# Patient Record
Sex: Male | Born: 1990 | Race: Black or African American | Hispanic: No | Marital: Single | Smoking: Never smoker
Health system: Southern US, Community
[De-identification: ages and names within clinical notes are randomized; demographics above are authoritative.]

## PROBLEM LIST (undated history)

## (undated) HISTORY — PX: MULTIPLE TOOTH EXTRACTIONS: SHX2053

---
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
## 2013-02-28 ENCOUNTER — Ambulatory Visit
Admission: RE | Admit: 2013-02-28 | Discharge: 2013-02-28 | Disposition: A | Discharge status: Home / Self Care | Payer: BC Managed Care – PPO | Source: Ambulatory Visit | Attending: Orthopedic Surgery | Admitting: Orthopedic Surgery

## 2013-02-28 ENCOUNTER — Other Ambulatory Visit: Payer: Self-pay | Admitting: Orthopedic Surgery

## 2013-02-28 DIAGNOSIS — M719 Bursopathy, unspecified: Secondary | ICD-10-CM

## 2013-09-08 ENCOUNTER — Other Ambulatory Visit: Payer: Self-pay | Admitting: Family Medicine

## 2013-09-08 ENCOUNTER — Ambulatory Visit
Admission: RE | Admit: 2013-09-08 | Discharge: 2013-09-08 | Disposition: A | Discharge status: Home / Self Care | Payer: BC Managed Care – PPO | Source: Ambulatory Visit | Attending: Family Medicine | Admitting: Family Medicine

## 2013-09-08 DIAGNOSIS — R109 Unspecified abdominal pain: Secondary | ICD-10-CM

## 2013-09-08 MED ORDER — IOHEXOL 350 MG/ML SOLN: 100.0000 mL | Freq: Once | INTRAVENOUS | Status: AC | PRN

## 2013-09-10 ENCOUNTER — Other Ambulatory Visit: Payer: Self-pay | Admitting: Gastroenterology

## 2013-09-10 ENCOUNTER — Other Ambulatory Visit: Payer: BC Managed Care – PPO

## 2013-09-10 DIAGNOSIS — R110 Nausea: Secondary | ICD-10-CM

## 2013-09-10 DIAGNOSIS — R1011 Right upper quadrant pain: Secondary | ICD-10-CM

## 2013-09-11 ENCOUNTER — Ambulatory Visit
Admission: RE | Admit: 2013-09-11 | Discharge: 2013-09-11 | Disposition: A | Discharge status: Home / Self Care | Payer: BC Managed Care – PPO | Source: Ambulatory Visit | Attending: Gastroenterology | Admitting: Gastroenterology

## 2013-09-11 DIAGNOSIS — R110 Nausea: Secondary | ICD-10-CM

## 2013-09-11 DIAGNOSIS — R1011 Right upper quadrant pain: Secondary | ICD-10-CM

## 2013-09-29 ENCOUNTER — Encounter (HOSPITAL_COMMUNITY)
Admission: RE | Admit: 2013-09-29 | Discharge: 2013-09-29 | Disposition: A | Discharge status: Home / Self Care | Payer: BC Managed Care – PPO | Source: Ambulatory Visit | Attending: Gastroenterology | Admitting: Gastroenterology

## 2013-09-29 DIAGNOSIS — R1011 Right upper quadrant pain: Secondary | ICD-10-CM

## 2013-09-29 DIAGNOSIS — R110 Nausea: Secondary | ICD-10-CM

## 2013-09-29 MED ORDER — TECHNETIUM TC 99M MEBROFENIN IV KIT
5.0000 | PACK | Freq: Once | INTRAVENOUS | Status: AC | PRN
  Administered 2013-09-29: 5 via INTRAVENOUS

## 2013-09-29 MED ORDER — STERILE WATER FOR INJECTION IJ SOLN
INTRAMUSCULAR | Status: AC
  Filled 2013-09-29: qty 10

## 2013-09-29 MED ORDER — SINCALIDE 5 MCG IJ SOLR
INTRAMUSCULAR | Status: AC
  Administered 2013-09-29: 1.45 ug
  Filled 2013-09-29: qty 5

## 2014-02-02 ENCOUNTER — Other Ambulatory Visit: Payer: Self-pay | Admitting: Orthopedic Surgery

## 2014-02-02 ENCOUNTER — Ambulatory Visit
Admission: RE | Admit: 2014-02-02 | Discharge: 2014-02-02 | Disposition: A | Discharge status: Home / Self Care | Payer: BC Managed Care – PPO | Source: Ambulatory Visit | Attending: Orthopedic Surgery | Admitting: Orthopedic Surgery

## 2014-02-02 DIAGNOSIS — M25571 Pain in right ankle and joints of right foot: Secondary | ICD-10-CM

## 2015-01-29 IMAGING — NM NM HEPATO W/GB/PHARM/[PERSON_NAME]
2 series · 12 of 12 positions shown · non-contrast
Comparison: Abdominal ultrasound and CT scan dated 09/08/2013

RADIOPHARMACEUTICALS:  5.0 mCi technetium Choletec.

1.5 mcg CCK IV.

CLINICAL DATA: Right upper quadrant pain and nausea.

EXAM:
NUCLEAR MEDICINE HEPATOBILIARY IMAGING WITH GALLBLADDER EF
TECHNIQUE: Sequential images of the abdomen were obtained [DATE] minutes
following intravenous administration of radiopharmaceutical. After
oral ingestion of Ensure, gallbladder ejection fraction was
determined.

[Series 0: hepatobiliary · 3.20mm/px · 6 of 60 frames shown (1 of 2)]
[frame 6/60]
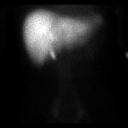
[frame 16/60]
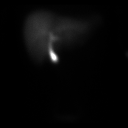
[frame 26/60]
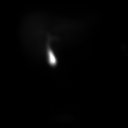
[frame 36/60]
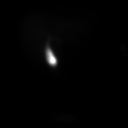
[frame 46/60]
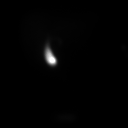
[frame 56/60]
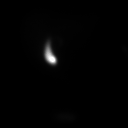

[Series 0: hepatobiliary · 3.20mm/px · 6 of 30 frames shown (2 of 2)]
[frame 3/30]
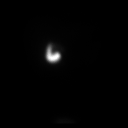
[frame 8/30]
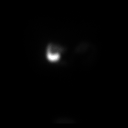
[frame 13/30]
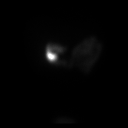
[frame 18/30]
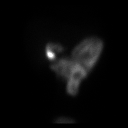
[frame 23/30]
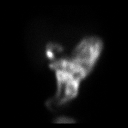
[frame 28/30]
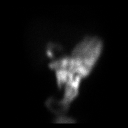

[12 of 12 positions shown; findings below may reference images not displayed]

FINDINGS: The gallbladder is visualized at 10 min. Ejection fraction was 94%
at 29 min.
IMPRESSION: Normal hepatobiliary scan.

## 2015-06-04 IMAGING — CR DG ANKLE COMPLETE 3+V*R*
3 series · 3 of 3 positions shown · non-contrast
Comparison: None.

CLINICAL DATA: Pain post trauma

EXAM:
RIGHT ANKLE - COMPLETE 3+ VIEW

[view not recorded (1 of 3)]
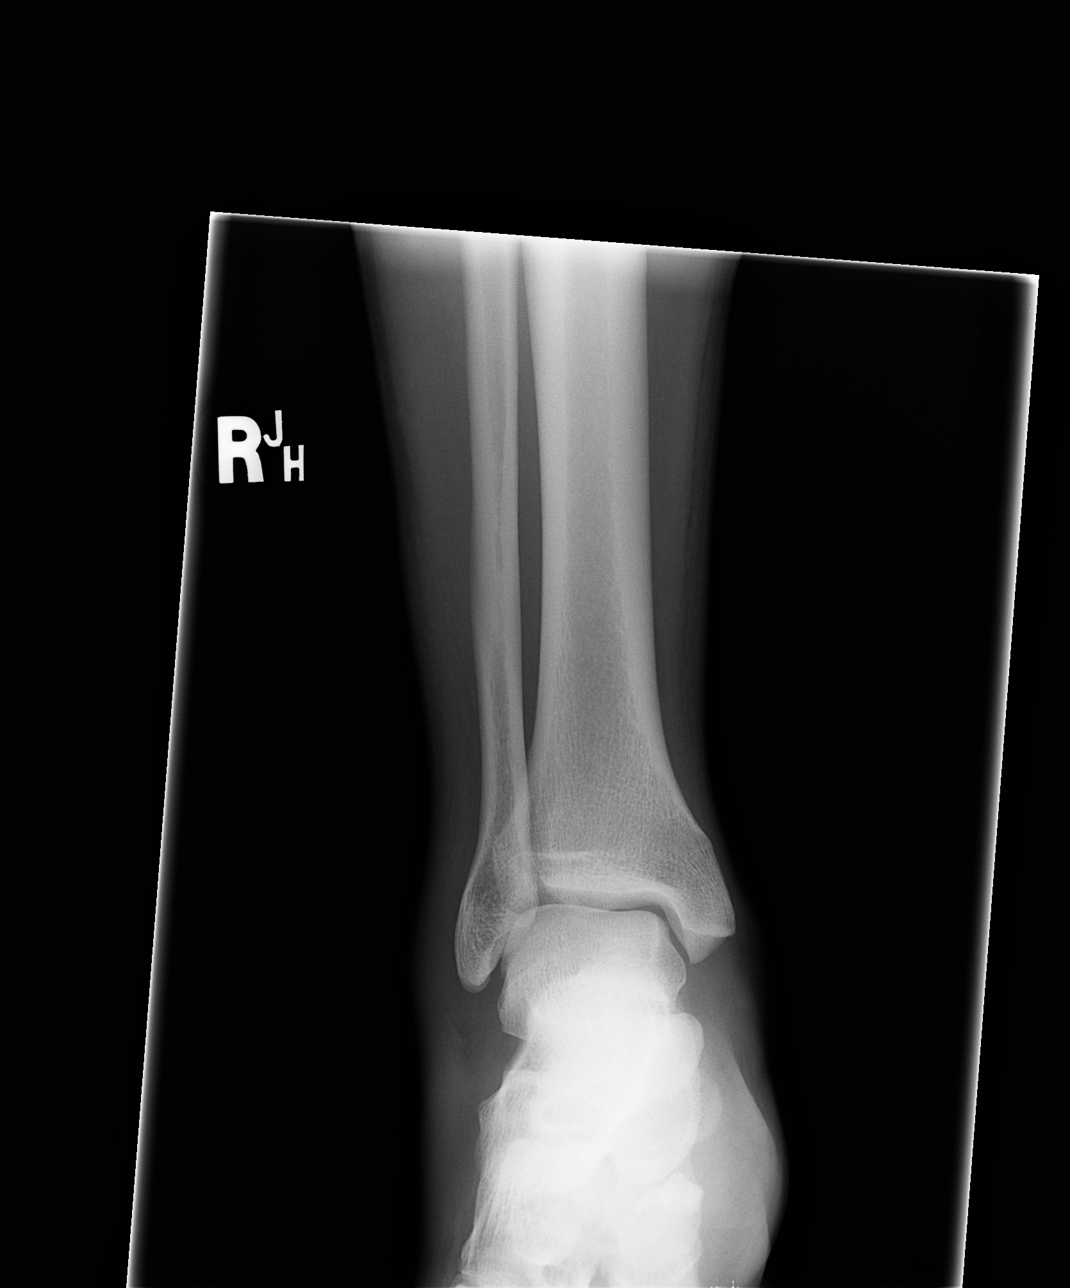

[view not recorded (2 of 3)]
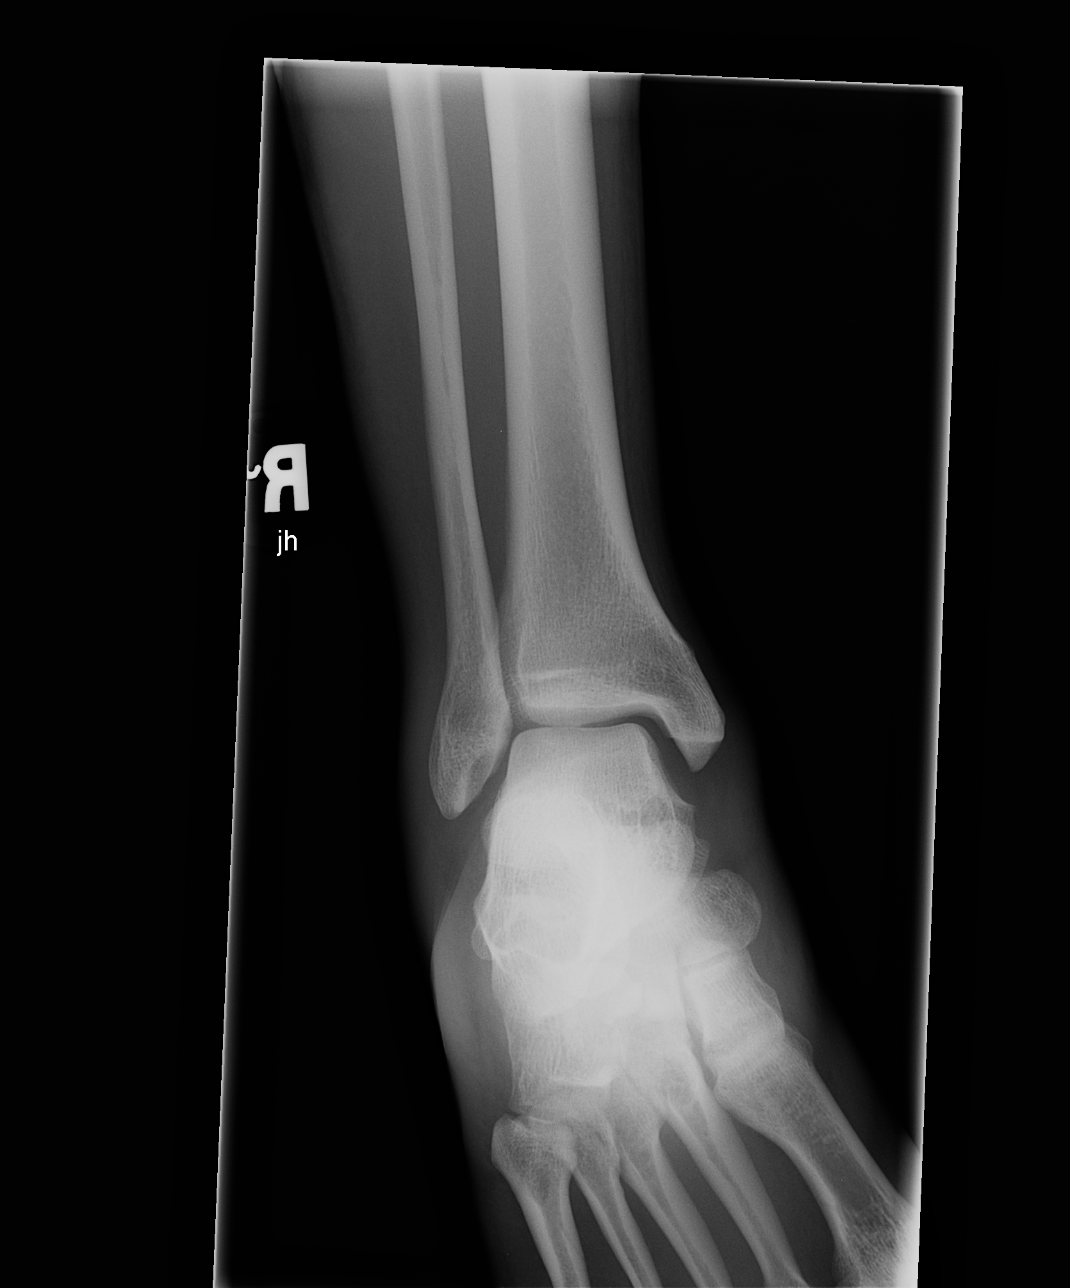

[view not recorded (3 of 3)]
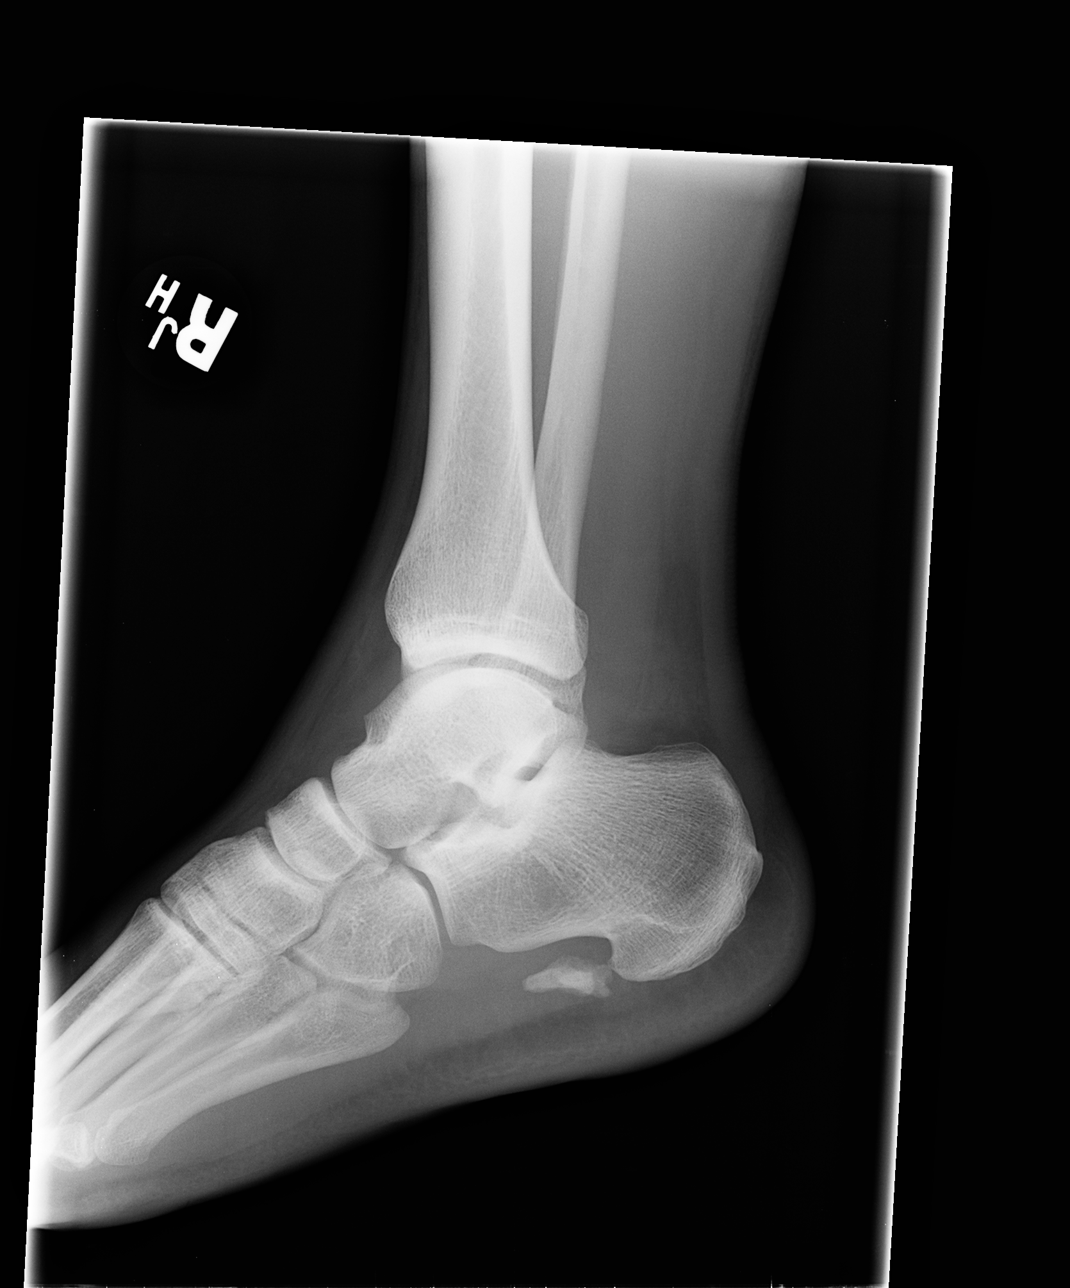

[3 of 3 positions shown; findings below may reference images not displayed]

FINDINGS: Frontal, oblique, and lateral views were obtained. There is swelling
laterally. No fracture or effusion. Ankle mortise appears intact.
There is calcification in the plantar fascia at the level of the mid
calcaneus.
IMPRESSION: Probable chronic plantar fasciitis with extensive calcification in
this area inferior to the calcaneus. Some of this calcification may
represent myositis ossificans.

There is soft tissue swelling laterally. No fracture. Mortise
intact.

## 2016-07-13 DIAGNOSIS — C155 Malignant neoplasm of lower third of esophagus: Secondary | ICD-10-CM | POA: Diagnosis not present

## 2016-07-13 DIAGNOSIS — J0100 Acute maxillary sinusitis, unspecified: Secondary | ICD-10-CM | POA: Diagnosis not present

## 2016-07-13 DIAGNOSIS — J069 Acute upper respiratory infection, unspecified: Secondary | ICD-10-CM | POA: Diagnosis not present

## 2016-07-14 DIAGNOSIS — R0683 Snoring: Secondary | ICD-10-CM | POA: Diagnosis not present

## 2016-07-14 DIAGNOSIS — J343 Hypertrophy of nasal turbinates: Secondary | ICD-10-CM | POA: Diagnosis not present

## 2016-07-14 DIAGNOSIS — M2669 Other specified disorders of temporomandibular joint: Secondary | ICD-10-CM | POA: Diagnosis not present

## 2016-07-14 DIAGNOSIS — H938X3 Other specified disorders of ear, bilateral: Secondary | ICD-10-CM | POA: Diagnosis not present

## 2016-07-21 DIAGNOSIS — G4730 Sleep apnea, unspecified: Secondary | ICD-10-CM | POA: Diagnosis not present

## 2016-07-22 DIAGNOSIS — Z113 Encounter for screening for infections with a predominantly sexual mode of transmission: Secondary | ICD-10-CM | POA: Diagnosis not present

## 2016-07-22 DIAGNOSIS — S80812S Abrasion, left lower leg, sequela: Secondary | ICD-10-CM | POA: Diagnosis not present

## 2016-07-23 DIAGNOSIS — Z113 Encounter for screening for infections with a predominantly sexual mode of transmission: Secondary | ICD-10-CM | POA: Diagnosis not present

## 2016-08-25 DIAGNOSIS — H6981 Other specified disorders of Eustachian tube, right ear: Secondary | ICD-10-CM | POA: Diagnosis not present

## 2016-08-25 DIAGNOSIS — J342 Deviated nasal septum: Secondary | ICD-10-CM | POA: Diagnosis not present

## 2016-08-25 DIAGNOSIS — J343 Hypertrophy of nasal turbinates: Secondary | ICD-10-CM | POA: Diagnosis not present

## 2016-08-25 DIAGNOSIS — G4733 Obstructive sleep apnea (adult) (pediatric): Secondary | ICD-10-CM | POA: Diagnosis not present

## 2017-04-27 DIAGNOSIS — Z113 Encounter for screening for infections with a predominantly sexual mode of transmission: Secondary | ICD-10-CM | POA: Diagnosis not present

## 2017-04-27 DIAGNOSIS — Z114 Encounter for screening for human immunodeficiency virus [HIV]: Secondary | ICD-10-CM | POA: Diagnosis not present

## 2017-06-04 DIAGNOSIS — H8149 Vertigo of central origin, unspecified ear: Secondary | ICD-10-CM | POA: Diagnosis not present

## 2017-06-04 DIAGNOSIS — T7840XA Allergy, unspecified, initial encounter: Secondary | ICD-10-CM | POA: Diagnosis not present

## 2017-06-06 DIAGNOSIS — R42 Dizziness and giddiness: Secondary | ICD-10-CM | POA: Diagnosis not present

## 2017-06-06 DIAGNOSIS — H8143 Vertigo of central origin, bilateral: Secondary | ICD-10-CM | POA: Diagnosis not present

## 2017-06-06 DIAGNOSIS — R531 Weakness: Secondary | ICD-10-CM | POA: Diagnosis not present

## 2017-06-06 DIAGNOSIS — R7309 Other abnormal glucose: Secondary | ICD-10-CM | POA: Diagnosis not present

## 2017-06-06 DIAGNOSIS — J301 Allergic rhinitis due to pollen: Secondary | ICD-10-CM | POA: Diagnosis not present

## 2017-06-07 DIAGNOSIS — H8112 Benign paroxysmal vertigo, left ear: Secondary | ICD-10-CM | POA: Diagnosis not present

## 2017-06-12 ENCOUNTER — Ambulatory Visit (INDEPENDENT_AMBULATORY_CARE_PROVIDER_SITE_OTHER): Payer: BLUE CROSS/BLUE SHIELD | Admitting: Allergy and Immunology

## 2017-06-12 ENCOUNTER — Encounter: Payer: Self-pay | Admitting: Allergy and Immunology

## 2017-06-12 VITALS — BP 118/68 | HR 60 | Temp 97.8°F | Resp 14 | Ht 66.0 in | Wt 169.0 lb

## 2017-06-12 DIAGNOSIS — H6983 Other specified disorders of Eustachian tube, bilateral: Secondary | ICD-10-CM | POA: Diagnosis not present

## 2017-06-12 DIAGNOSIS — H8112 Benign paroxysmal vertigo, left ear: Secondary | ICD-10-CM | POA: Diagnosis not present

## 2017-06-12 DIAGNOSIS — H1045 Other chronic allergic conjunctivitis: Secondary | ICD-10-CM | POA: Diagnosis not present

## 2017-06-12 DIAGNOSIS — J3089 Other allergic rhinitis: Secondary | ICD-10-CM | POA: Diagnosis not present

## 2017-06-12 DIAGNOSIS — K219 Gastro-esophageal reflux disease without esophagitis: Secondary | ICD-10-CM

## 2017-06-12 DIAGNOSIS — H1010 Acute atopic conjunctivitis, unspecified eye: Secondary | ICD-10-CM

## 2017-06-12 DIAGNOSIS — R42 Dizziness and giddiness: Secondary | ICD-10-CM

## 2017-06-12 DIAGNOSIS — G43909 Migraine, unspecified, not intractable, without status migrainosus: Secondary | ICD-10-CM

## 2017-06-12 MED ORDER — AZELASTINE-FLUTICASONE 137-50 MCG/ACT NA SUSP: 1.0000 | Freq: Two times a day (BID) | NASAL | 5 refills | Status: DC

## 2017-06-12 MED ORDER — METHYLPREDNISOLONE ACETATE 80 MG/ML IJ SUSP
80.0000 mg | Freq: Once | INTRAMUSCULAR | Status: AC
  Administered 2017-06-12: 80 mg via INTRAMUSCULAR

## 2017-06-12 MED ORDER — MONTELUKAST SODIUM 10 MG PO TABS: 10.0000 mg | ORAL_TABLET | Freq: Every day | ORAL | 5 refills | Status: AC

## 2017-06-12 MED ORDER — OLOPATADINE HCL 0.7 % OP SOLN: 1.0000 [drp] | Freq: Every day | OPHTHALMIC | 5 refills | Status: AC

## 2017-06-12 NOTE — Patient Instructions (Addendum)
  1. Allergen avoidance measures  2. Eliminate all sources of caffeine and chocolate  3. Treat and prevent inflammation:   A. Dymista - one spray each nostril twice a day  B. montelukast 10 mg - one tablet one time per day  C. Depo-Medrol 80 IM delivered in clinic today  4. If needed:   A. nasal saline  B. OTC antihistamine - Allegra/Claritin/Zyrtec  C. Pazeo - one drop each eye one time per day  D. OTC Systane eyedrops  5. Return to clinic in 4 weeks or earlier if problem  6. Consider a course of immunotherapy

## 2017-06-12 NOTE — Progress Notes (Signed)
Dear Dr. Parke Simmers,  Thank you for referring Javier Sanchez to the [facility] [place] on 06/12/2017.   Below is a summation of this patient's evaluation and recommendations.  Thank you for your referral. I will keep you informed about this patient's response to treatment.   If you have any questions please do not hesitate to contact me.   Sincerely,  Jessica Priest, MD Allergy / Immunology  [facility]   ______________________________________________________________________    NEW PATIENT NOTE  Referring Provider: Renaye Rakers, MD Primary Provider: Renaye Rakers, MD Date of office visit: 06/12/2017    Subjective:   Chief Complaint:  Javier Sanchez (DOB: 1991-12-10) is a 26 y.o. male who presents to the clinic on 06/12/2017 with a chief complaint of Allergic Rhinitis  and Dizziness .     HPI: Javier Sanchez presents to this clinic in evaluation of problems with allergies.  He has a history of developing issues with nasal congestion and sneezing and itchy red watery eyes occurring on a perennial basis especially following exposure to pollen. He has tried multiple medications including Flonase and various antihistamines and decongestants and Afrin. He finds that Afrin does help him but he does not use this medication very often. He found that Flonase really did not help him very much. Maybe Allegra-D gives him some relief regarding this issue. He hs seen a eye doctor who is given him over-the-counter wetting solution eyedrops for his dry eyes.  He also appears to have problems with lots of ear popping for which he saw Dr. Lazarus Salines, ENT, who felt that he had turbinate hypertrophy and ETD. He has also developed problems with intermittent vertigo that does have a positional quality and he is undergoing physical therapy for this issue with positional maneuvers helping somewhat. He does not have any associated tinnitus  or hearing loss. Last year he did have a "water" sensation in his ears when he visited with Dr. Lazarus Salines but that has since resolved.  He also has headaches. He has headaches several times per week that lasts hours and are associated with a pressure-like or pushing out sensation from his frontal lobe and occasionally his temporal lobes without any associated scotoma or other neurological symptoms. There is no obvious trigger giving rise to this issue. He does drink tea about 3 times per week and has chocolate daily. He has apparently been evaluated for sleep apnea with a negative home sleep study.  He also has heartburn coming up high in his chest and a few times a week that does not require any therapy.  History reviewed. No pertinent past medical history.  Past Surgical History:  Procedure Laterality Date  . MULTIPLE TOOTH EXTRACTIONS      Allergies as of 06/12/2017   Not on File     Medication List      fexofenadine-pseudoephedrine 60-120 MG 12 hr tablet Commonly known as:  ALLEGRA-D Take by mouth.   fluticasone 50 MCG/ACT nasal spray Commonly known as:  FLONASE fluticasone 50 mcg/actuation nasal spray,suspension       Review of systems negative except as noted in HPI / PMHx or noted below:  Review of Systems  Constitutional: Negative.   HENT: Negative.   Eyes: Negative.   Respiratory: Negative.   Cardiovascular: Negative.   Gastrointestinal: Negative.   Genitourinary: Negative.   Musculoskeletal: Negative.   Skin: Negative.   Neurological: Negative.   Endo/Heme/Allergies: Negative.   Psychiatric/Behavioral: Negative.  Family History  Problem Relation Age of Onset  . Allergic rhinitis Neg Hx   . Angioedema Neg Hx   . Asthma Neg Hx   . Atopy Neg Hx   . Eczema Neg Hx   . Immunodeficiency Neg Hx   . Urticaria Neg Hx     Social History   Social History  . Marital status: Single    Spouse name: N/A  . Number of children: N/A  . Years of education: N/A     Occupational History  . Not on file.   Social History Main Topics  . Smoking status: Never Smoker  . Smokeless tobacco: Never Used  . Alcohol use No  . Drug use: No  . Sexual activity: Not on file   Other Topics Concern  . Not on file   Social History Narrative  . No narrative on file    Environmental and Social history  Lives in a house with a dry environment, no animals located inside the household, carpeting in the bedroom, no plastic on the bed, no plastic on the pillow, and no smokers located inside the household. He is a Government social research officer.  Objective:   Vitals:   06/12/17 0846  BP: 118/68  Pulse: 60  Resp: 14  Temp: 97.8 F (36.6 C)   Height: 5\' 6"  (167.6 cm) Weight: 169 lb (76.7 kg)  Physical Exam  Constitutional: He is well-developed, well-nourished, and in no distress.  Nasal voice  HENT:  Head: Normocephalic. Head is without right periorbital erythema and without left periorbital erythema.  Right Ear: Tympanic membrane, external ear and ear canal normal.  Left Ear: Tympanic membrane, external ear and ear canal normal.  Nose: Mucosal edema present. No rhinorrhea.  Mouth/Throat: Oropharynx is clear and moist and mucous membranes are normal. No oropharyngeal exudate.  Eyes: Conjunctivae and lids are normal. Pupils are equal, round, and reactive to light.  Neck: Trachea normal. No tracheal deviation present. No thyromegaly present.  Cardiovascular: Normal rate, regular rhythm, S1 normal, S2 normal and normal heart sounds.   No murmur heard. Pulmonary/Chest: Effort normal. No stridor. No tachypnea. No respiratory distress. He has no wheezes. He has no rales. He exhibits no tenderness.  Abdominal: Soft. He exhibits no distension and no mass. There is no hepatosplenomegaly. There is no tenderness. There is no rebound and no guarding.  Musculoskeletal: He exhibits no edema or tenderness.  Lymphadenopathy:       Head (right side): No tonsillar  adenopathy present.       Head (left side): No tonsillar adenopathy present.    He has no cervical adenopathy.    He has no axillary adenopathy.  Neurological: He is alert. Gait normal.  Skin: No rash noted. He is not diaphoretic. No erythema. No pallor. Nails show no clubbing.  Psychiatric: Mood and affect normal.    Diagnostics: Allergy skin tests were performed. He demonstrated hypersensitivity to trees, grasses, weeds, house dust mite, molds, and cat  Assessment and Plan:    1. Other allergic rhinitis   2. Seasonal allergic conjunctivitis   3. Migraine syndrome   4. Gastroesophageal reflux disease, esophagitis presence not specified   5. ETD (Eustachian tube dysfunction), bilateral   6. Vertigo     1. Allergen avoidance measures  2. Eliminate all sources of caffeine and chocolate  3. Treat and prevent inflammation:   A. Dymista - one spray each nostril twice a day  B. montelukast 10 mg - one tablet one time per day  C. Depo-Medrol 80 IM delivered in clinic today  4. If needed:   A. nasal saline  B. OTC antihistamine - Allegra/Claritin/Zyrtec  C. Pazeo - one drop each eye one time per day  D. OTC Systane eyedrops  5. Return to clinic in 4 weeks or earlier if problem  6. Consider a course of immunotherapy  Javier Sanchez has a rather significantly activated immune system with an atopic phenotype and we will get him to perform allergen avoidance measures as best as possible and use anti-inflammatory medications for his respiratory tract and conjunctiva and eustachian tubes as noted above. As well, I would like for him to eliminate all caffeine and chocolate consumption as he does appear to have chronic headaches and also appears to have reflux. I will regroup with him in 4 weeks to assess his response to therapy and consider further evaluation and treatment pending his response.  Jessica PriestEric J. Damein Gaunce, MD [place] [facility] [place]

## 2017-06-13 DIAGNOSIS — R42 Dizziness and giddiness: Secondary | ICD-10-CM | POA: Diagnosis not present

## 2017-06-13 MED ORDER — METHYLPREDNISOLONE ACETATE 80 MG/ML IJ SUSP: 80.0000 mg | Freq: Once | INTRAMUSCULAR | Status: DC

## 2017-06-13 NOTE — Addendum Note (Signed)
Addended by: Clifton JamesLARK, HEATHER L on: 06/13/2017 08:22 AM   Modules accepted: Orders

## 2017-06-14 DIAGNOSIS — R42 Dizziness and giddiness: Secondary | ICD-10-CM | POA: Diagnosis not present

## 2017-06-14 DIAGNOSIS — R531 Weakness: Secondary | ICD-10-CM | POA: Diagnosis not present

## 2017-06-14 DIAGNOSIS — H8143 Vertigo of central origin, bilateral: Secondary | ICD-10-CM | POA: Diagnosis not present

## 2017-06-14 DIAGNOSIS — H8112 Benign paroxysmal vertigo, left ear: Secondary | ICD-10-CM | POA: Diagnosis not present

## 2017-06-14 DIAGNOSIS — J301 Allergic rhinitis due to pollen: Secondary | ICD-10-CM | POA: Diagnosis not present

## 2017-06-25 ENCOUNTER — Telehealth: Payer: Self-pay

## 2017-06-25 MED ORDER — AZELASTINE HCL 137 MCG/SPRAY NA SOLN: 1.0000 | Freq: Two times a day (BID) | NASAL | 5 refills | Status: AC

## 2017-06-25 MED ORDER — FLUTICASONE PROPIONATE 50 MCG/ACT NA SUSP: 1.0000 | Freq: Two times a day (BID) | NASAL | 5 refills | Status: AC

## 2017-06-25 NOTE — Telephone Encounter (Signed)
Yes, fluticasone and azelastine would work

## 2017-06-25 NOTE — Addendum Note (Signed)
Addended by: Mliss FritzBLACK, Roniya Tetro I on: 06/25/2017 02:38 PM   Modules accepted: Orders

## 2017-06-25 NOTE — Telephone Encounter (Signed)
[facility] to [facility]. Is this okay to do?

## 2017-07-12 DIAGNOSIS — H8112 Benign paroxysmal vertigo, left ear: Secondary | ICD-10-CM | POA: Diagnosis not present

## 2017-07-24 ENCOUNTER — Encounter: Payer: Self-pay | Admitting: Allergy and Immunology

## 2017-07-24 ENCOUNTER — Ambulatory Visit (INDEPENDENT_AMBULATORY_CARE_PROVIDER_SITE_OTHER): Payer: BLUE CROSS/BLUE SHIELD | Admitting: Allergy and Immunology

## 2017-07-24 VITALS — BP 120/82 | HR 64 | Resp 16

## 2017-07-24 DIAGNOSIS — K219 Gastro-esophageal reflux disease without esophagitis: Secondary | ICD-10-CM | POA: Diagnosis not present

## 2017-07-24 DIAGNOSIS — H1010 Acute atopic conjunctivitis, unspecified eye: Secondary | ICD-10-CM

## 2017-07-24 DIAGNOSIS — G43909 Migraine, unspecified, not intractable, without status migrainosus: Secondary | ICD-10-CM | POA: Diagnosis not present

## 2017-07-24 DIAGNOSIS — H1045 Other chronic allergic conjunctivitis: Secondary | ICD-10-CM | POA: Diagnosis not present

## 2017-07-24 DIAGNOSIS — R400 Somnolence: Secondary | ICD-10-CM | POA: Diagnosis not present

## 2017-07-24 DIAGNOSIS — J3089 Other allergic rhinitis: Secondary | ICD-10-CM | POA: Diagnosis not present

## 2017-07-24 DIAGNOSIS — H6983 Other specified disorders of Eustachian tube, bilateral: Secondary | ICD-10-CM

## 2017-07-24 NOTE — Patient Instructions (Addendum)
  1. Continue to perform Allergen avoidance measures  2. Continue to Eliminate all sources of caffeine and chocolate  3. Continue to Treat and prevent inflammation:   A. Fluticasone + azelastine - one spray each nostril twice a day  B. montelukast 10 mg - one tablet one time per day  4. If needed:   A. nasal saline  B. OTC antihistamine - Allegra/Claritin/Zyrtec  C. Pazeo - one drop each eye one time per day  D. OTC Systane eyedrops  5. Obtain a split study sleep test  6. Consider a course of immunotherapy if medical therapy fails  7. Obtain a fall flu vaccine  8. Return to clinic in 6 months or earlier if probelm

## 2017-07-24 NOTE — Progress Notes (Signed)
Follow-up Note  Referring Provider: Renaye Sanchez, Veita, Javier Sanchez Primary Provider: Renaye Sanchez, Veita, Javier Sanchez Date of Office Visit: 07/24/2017  Subjective:   Javier Sanchez (DOB: Apr 01, 1991) is a 26 y.o. male who [facility]2017 in re-evaluation of the following:  HPI: Javier Sanchez returns to this clinic in reevaluation of his allergic rhinoconjunctivitis and ETD as well as headaches and reflux. I last saw him in this clinic during his initial evaluation  06/12/2017.  He is done very well regarding his respiratory tract issues and his ear issues. He feels more open and he has no sneezing and he has no rhinorrhea and he has no issues with his ears.  He has manipulated his environment and has performed house dust avoidance measures. He has been consistently using medical therapy as prescribed.  Unfortunately, even though he has eliminated all caffeine and chocolate consumption he still continues to have headaches. He has the same frequency and same intensity and same character headaches that he had during his previous evaluation. In questioning him further about his sleep study that was performed approximately 2 years ago which was a home sleep study he apparently did have "mild-to-moderate" sleep apnea. He is very unrefreshed in the morning and occasionally is sleepy during the day.  He no longer has heartburn.  Allergies as of 07/24/2017   No Known Allergies     Medication List      Azelastine HCl 137 MCG/SPRAY Soln Place 1 spray into the nose 2 (two) times daily.   fexofenadine-pseudoephedrine 60-120 MG 12 hr tablet Commonly known as:  ALLEGRA-D Take by mouth.   fluticasone 50 MCG/ACT nasal spray Commonly known as:  FLONASE Place 1 spray into both nostrils 2 (two) times daily.   montelukast 10 MG tablet Commonly known as:  SINGULAIR Take 1 tablet (10 mg total) by mouth at bedtime.   Olopatadine HCl 0.7 % Soln Commonly known as:  PAZEO Place 1 drop into both  eyes daily.       No past medical history on file.  Past Surgical History:  Procedure Laterality Date  . MULTIPLE TOOTH EXTRACTIONS      Review of systems negative except as noted in HPI / PMHx or noted below:  Review of Systems  Constitutional: Negative.   HENT: Negative.   Eyes: Negative.   Respiratory: Negative.   Cardiovascular: Negative.   Gastrointestinal: Negative.   Genitourinary: Negative.   Musculoskeletal: Negative.   Skin: Negative.   Neurological: Negative.   Endo/Heme/Allergies: Negative.   Psychiatric/Behavioral: Negative.      Objective:   Vitals:   07/24/17 1103  BP: 120/82  Pulse: 64  Resp: 16          Physical Exam  Constitutional: He is well-developed, well-nourished, and in no distress.  HENT:  Head: Normocephalic.  Right Ear: Tympanic membrane, external ear and ear canal normal.  Left Ear: Tympanic membrane, external ear and ear canal normal.  Nose: Nose normal. No mucosal edema or rhinorrhea.  Mouth/Throat: Uvula is midline, oropharynx is clear and moist and mucous membranes are normal. No oropharyngeal exudate.  Eyes: Conjunctivae are normal.  Neck: Trachea normal. No tracheal tenderness present. No tracheal deviation present. No thyromegaly present.  Cardiovascular: Normal rate, regular rhythm, S1 normal, S2 normal and normal heart sounds.   No murmur heard. Pulmonary/Chest: Breath sounds normal. No stridor. No respiratory distress. He has no wheezes. He has no rales.  Musculoskeletal: He exhibits no edema.  Lymphadenopathy:  Head (right side): No tonsillar adenopathy present.       Head (left side): No tonsillar adenopathy present.    He has no cervical adenopathy.  Neurological: He is alert. Gait normal.  Skin: No rash noted. He is not diaphoretic. No erythema. Nails show no clubbing.  Psychiatric: Mood and affect normal.    Diagnostics: none   Assessment and Plan:   1. Other allergic rhinitis   2. Seasonal allergic  conjunctivitis   3. ETD (Eustachian tube dysfunction), bilateral   4. Migraine syndrome   5. Gastroesophageal reflux disease, esophagitis presence not specified   6. Daytime sleepiness     1. Continue to perform Allergen avoidance measures  2. Continue to Eliminate all sources of caffeine and chocolate  3. Continue to Treat and prevent inflammation:   A. Fluticasone + azelastine - one spray each nostril twice a day  B. montelukast 10 mg - one tablet one time per day  4. If needed:   A. nasal saline  B. OTC antihistamine - Allegra/Claritin/Zyrtec  C. Pazeo - one drop each eye one time per day  D. OTC Systane eyedrops  5. Obtain a split study sleep test  6. Consider a course of immunotherapy if medical therapy fails  7. Obtain a fall flu vaccine  8. Return to clinic in 6 months or earlier if probelm  Javier Sanchez has really done quite well on his plan regarding his atopic conjunctival and upper airway issues and he will continue to use this therapy and we will see what happens over the course of the next 6 months. If he fails this form of treatment he would be a candidate for immunotherapy. I think we need to work through his headaches in more detail and obtain a formal sleep study rather than a home study to define exactly what is going on with his respiratory pattern at night. If his sleep study does not identify any significant amount of sleep apnea and he still has headaches then he will need to use a preventative agent. If his sleep study does identify sleep apnea then this needs to be treated appropriately.  Javier Schimke, Javier Sanchez Allergy / Immunology [facility]

## 2017-08-02 ENCOUNTER — Telehealth: Payer: Self-pay | Admitting: *Deleted

## 2017-08-02 ENCOUNTER — Other Ambulatory Visit: Payer: Self-pay | Admitting: *Deleted

## 2017-08-02 DIAGNOSIS — G4730 Sleep apnea, unspecified: Secondary | ICD-10-CM

## 2017-08-02 DIAGNOSIS — R5382 Chronic fatigue, unspecified: Secondary | ICD-10-CM

## 2017-08-02 NOTE — Telephone Encounter (Signed)
Called patient and advised him that his insurance will not cover split night (in lab) sleep study would only approve a home sleep test.  He advised that he really did not want to do another home test since he just had one a year ago and it cost him a lot out of pocket.  I told him to fax me results of his previous study to have Dr Lucie LeatherKozlow look at results and see what he recommends as alternative

## 2017-10-04 DIAGNOSIS — Z23 Encounter for immunization: Secondary | ICD-10-CM | POA: Diagnosis not present

## 2018-07-01 DIAGNOSIS — Z111 Encounter for screening for respiratory tuberculosis: Secondary | ICD-10-CM | POA: Diagnosis not present

## 2018-09-21 ENCOUNTER — Other Ambulatory Visit: Payer: Self-pay | Admitting: Allergy and Immunology

## 2023-06-26 ENCOUNTER — Other Ambulatory Visit: Payer: Self-pay | Admitting: Family Medicine

## 2023-06-26 ENCOUNTER — Encounter: Payer: Self-pay | Admitting: Family Medicine

## 2023-06-26 DIAGNOSIS — K5710 Diverticulosis of small intestine without perforation or abscess without bleeding: Secondary | ICD-10-CM

## 2023-07-09 ENCOUNTER — Ambulatory Visit
Admission: RE | Admit: 2023-07-09 | Discharge: 2023-07-09 | Disposition: A | Discharge status: Home / Self Care | Payer: BLUE CROSS/BLUE SHIELD | Source: Ambulatory Visit | Attending: Family Medicine | Admitting: Family Medicine

## 2023-07-09 DIAGNOSIS — K5710 Diverticulosis of small intestine without perforation or abscess without bleeding: Secondary | ICD-10-CM

## 2023-07-09 MED ORDER — IOPAMIDOL (ISOVUE-300) INJECTION 61%
80.0000 mL | Freq: Once | INTRAVENOUS | Status: AC | PRN
  Administered 2023-07-09: 80 mL via INTRAVENOUS
